# Patient Record
Sex: Female | Born: 2005 | Race: Black or African American | Hispanic: No | Marital: Single
Health system: Southern US, Community
[De-identification: ages and names within clinical notes are randomized; demographics above are authoritative.]

## PROBLEM LIST (undated history)

## (undated) DIAGNOSIS — IMO0002 Reserved for concepts with insufficient information to code with codable children: Secondary | ICD-10-CM

---
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
## 2024-04-06 ENCOUNTER — Encounter (HOSPITAL_COMMUNITY): Payer: Self-pay | Admitting: *Deleted

## 2024-04-06 ENCOUNTER — Ambulatory Visit (HOSPITAL_COMMUNITY)
Admission: EM | Admit: 2024-04-06 | Discharge: 2024-04-06 | Disposition: A | Discharge status: Home / Self Care | Attending: Physician Assistant | Admitting: Physician Assistant

## 2024-04-06 ENCOUNTER — Other Ambulatory Visit: Payer: Self-pay

## 2024-04-06 DIAGNOSIS — J029 Acute pharyngitis, unspecified: Secondary | ICD-10-CM

## 2024-04-06 LAB — POCT RAPID STREP A (OFFICE): Rapid Strep A Screen: NEGATIVE

## 2024-04-06 NOTE — ED Triage Notes (Signed)
 PT reports a sore throat 3-4 days. Pt reports she has a hard time swallowing food due to pain. Pt reports shew has had strep throat before.

## 2024-04-06 NOTE — ED Provider Notes (Signed)
 [facility]    CSN: [id number] Arrival date & time: 04/06/24  1409      History   Chief Complaint Chief Complaint  Patient presents with   Sore Throat    HPI Charlene Franco is a 18 y.o. female.  has a past medical history of Laceration.   HPI  Discussed the use of AI scribe software for clinical note transcription with the patient, who gave verbal consent to proceed.  She presents with a sore throat.  She has experienced a sore throat for four to five days. She suspects she might have had a fever yesterday, as she had to leave work, but she is not certain. No chills have been noted. No ear pain, nasal congestion, postnasal drip, cough, dyspnea, wheezing, nausea, vomiting, diarrhea, or rashes. However, she does report body aches and headaches. She has not been in contact with anyone exhibiting similar symptoms and has not traveled recently.  She is not currently taking any medication to alleviate her symptoms. She reports previous unpleasant experience with a throat swab during today's encounter, which makes her hesitant to undergo the procedure again.     Past Medical History:  Diagnosis Date   Laceration     Patient Active Problem List   Diagnosis Date Noted   Closed fracture of upper end of right tibia with routine healing 08/06/2016   Dehydration 02/22/2012   Mouth pain 02/22/2012    Past Surgical History:  Procedure Laterality Date   KNEE ARTHROSCOPY Right 12/15/2016   Procedure: ARTHROSCOPY KNEE WITH REMOVAL OF HARDWARE;  Surgeon: Addie Glendia Hacker, MD;  Location: [facility] OR;  Service: Orthopedics;  Laterality: Right;   ORIF TIBIA FRACTURE Right 07/31/2016   Procedure: RIGHT ARTHROSCOPIC REDUCTION INTERNAL FIXATION (ORIF) EMINENCE AVULSION TIBIAL FRACTURE FIXATION;  Surgeon: Glendia Hacker Addie, MD;  Location: [facility] OR;  Service: Orthopedics;  Laterality: Right;    OB History   No obstetric history on file.      Home Medications    Prior to Admission  medications   Not on File    Family History Family History  Problem Relation Age of Onset   ADD / ADHD Brother     Social History Social History   Tobacco Use   Smoking status: Passive Smoke Exposure - Never Smoker   Smokeless tobacco: Never  Vaping Use   Vaping status: Never Used  Substance Use Topics   Alcohol use: No   Drug use: No     Allergies   No known allergies   Review of Systems Review of Systems  Constitutional:  Positive for fever (subjective). Negative for chills.  HENT:  Positive for congestion and sore throat. Negative for ear pain, postnasal drip and rhinorrhea.   Respiratory:  Negative for cough and shortness of breath.   Gastrointestinal:  Negative for diarrhea, nausea and vomiting.  Musculoskeletal:  Positive for myalgias.  Skin:  Negative for rash.  Neurological:  Positive for headaches. Negative for dizziness and light-headedness.     Physical Exam Triage Vital Signs ED Triage Vitals  Encounter Vitals Group     BP 04/06/24 1620 116/78     Girls Systolic BP Percentile --      Girls Diastolic BP Percentile --      Boys Systolic BP Percentile --      Boys Diastolic BP Percentile --      Pulse Rate 04/06/24 1620 98     Resp 04/06/24 1620 18     Temp 04/06/24 1620 98.3  F (36.8 C)     Temp src --      SpO2 04/06/24 1620 100 %     Weight --      Height --      Head Circumference --      Peak Flow --      Pain Score 04/06/24 1619 7     Pain Loc --      Pain Education --      Exclude from Growth Chart --    No data found.  Updated Vital Signs BP 116/78   Pulse 98   Temp 98.3 F (36.8 C)   Resp 18   LMP 04/03/2024   SpO2 100%   Visual Acuity Right Eye Distance:   Left Eye Distance:   Bilateral Distance:    Right Eye Near:   Left Eye Near:    Bilateral Near:     Physical Exam Vitals reviewed.  Constitutional:      General: She is awake. She is not in acute distress.    Appearance: Normal appearance. She is  well-developed and well-groomed. She is not ill-appearing, toxic-appearing or diaphoretic.  HENT:     Head: Normocephalic and atraumatic.     Right Ear: Hearing, tympanic membrane and ear canal normal.     Left Ear: Hearing, tympanic membrane and ear canal normal.     Mouth/Throat:     Lips: Pink.     Mouth: Mucous membranes are moist.     Pharynx: Oropharynx is clear. Uvula midline. Posterior oropharyngeal erythema present. No pharyngeal swelling, oropharyngeal exudate, uvula swelling or postnasal drip.     Tonsils: 0 on the right. 0 on the left.  Cardiovascular:     Rate and Rhythm: Normal rate and regular rhythm.     Pulses: Normal pulses.          Radial pulses are 2+ on the right side and 2+ on the left side.     Heart sounds: Normal heart sounds. No murmur heard.    No friction rub. No gallop.  Pulmonary:     Effort: Pulmonary effort is normal.     Breath sounds: Normal breath sounds. No decreased air movement. No decreased breath sounds, wheezing, rhonchi or rales.  Musculoskeletal:     Cervical back: Normal range of motion and neck supple.  Lymphadenopathy:     Head:     Right side of head: No submental, submandibular or preauricular adenopathy.     Left side of head: No submental, submandibular or preauricular adenopathy.     Cervical:     Right cervical: No superficial cervical adenopathy.    Left cervical: No superficial cervical adenopathy.     Upper Body:     Right upper body: No supraclavicular adenopathy.     Left upper body: No supraclavicular adenopathy.  Neurological:     General: No focal deficit present.     Mental Status: She is alert and oriented to person, place, and time.  Psychiatric:        Mood and Affect: Mood normal.        Behavior: Behavior normal. Behavior is cooperative.      UC Treatments / Results  Labs (all labs ordered are listed, but only abnormal results are displayed) Labs Reviewed  POCT RAPID STREP A (OFFICE)     EKG   Radiology No results found.  Procedures Procedures (including critical care time)  Medications Ordered in UC Medications - No data to display  Initial Impression /  Assessment and Plan / UC Course  I have reviewed the triage vital signs and the nursing notes.  Pertinent labs & imaging results that were available during my care of the patient were reviewed by me and considered in my medical decision making (see chart for details).      Final Clinical Impressions(s) / UC Diagnoses   Final diagnoses:  Sore throat  Pharyngitis, unspecified etiology   Patient presents today with concerns for sore throat, headaches, body aches, subjective fever has been ongoing for the past 4 to 5 days.  She is not currently taking any medications to help with her symptoms and denies recent sick contacts or travel.  Physical exam is largely reassuring with the exception of mild pharyngeal erythema.  Based on symptoms and presentation suspect acute pharyngitis likely secondary to viral URI.  Rapid strep testing is negative.  Recommend OTC medications for supportive care.  ED and return precautions reviewed and provided in AVS.  Follow-up as needed.    Discharge Instructions      Your testing was negative for strep.  At this time I suspect you likely have a viral upper respiratory infection which is causing your symptoms. To help manage your symptoms I recommend taking an over-the-counter medication such as ibuprofen  and Tylenol  to help with pain.  You can also use warm tea with honey, salt water gargles and make sure you are staying well-hydrated.  If you start to develop other symptoms you can also start using over-the-counter medications such as DayQuil/NyQuil, TheraFlu day and the night, Alka-Seltzer per your preference.  If you feel like your symptoms are getting worse, you are having difficulty breathing or swallowing, your fever is not responding to Tylenol  and ibuprofen , you feel like  your throat is closing please go to the emergency room.     ED Prescriptions   None    PDMP not reviewed this encounter.   Marylene Rocky BRAVO, PA-C 04/06/24 1733

## 2024-04-06 NOTE — Discharge Instructions (Addendum)
 Your testing was negative for strep.  At this time I suspect you likely have a viral upper respiratory infection which is causing your symptoms. To help manage your symptoms I recommend taking an over-the-counter medication such as ibuprofen  and Tylenol  to help with pain.  You can also use warm tea with honey, salt water gargles and make sure you are staying well-hydrated.  If you start to develop other symptoms you can also start using over-the-counter medications such as DayQuil/NyQuil, TheraFlu day and the night, Alka-Seltzer per your preference.  If you feel like your symptoms are getting worse, you are having difficulty breathing or swallowing, your fever is not responding to Tylenol  and ibuprofen , you feel like your throat is closing please go to the emergency room.

## 2024-04-06 NOTE — ED Notes (Signed)
 PT refused Strep test .
